# Patient Record
Sex: Female | Born: 1950 | Race: White | Hispanic: No | Marital: Married | Smoking: Never smoker
Health system: Southern US, Community
[De-identification: ages and names within clinical notes are randomized; demographics above are authoritative.]

## PROBLEM LIST (undated history)

## (undated) HISTORY — PX: COLON SURGERY: SHX602

## (undated) HISTORY — PX: THROAT SURGERY: SHX803

## (undated) HISTORY — PX: ABDOMINAL HYSTERECTOMY: SHX81

---

## 2010-05-08 ENCOUNTER — Ambulatory Visit: Payer: Self-pay | Admitting: Family Medicine

## 2011-07-19 ENCOUNTER — Ambulatory Visit: Payer: Self-pay | Admitting: Family Medicine

## 2019-12-23 ENCOUNTER — Emergency Department (HOSPITAL_BASED_OUTPATIENT_CLINIC_OR_DEPARTMENT_OTHER)
Admission: EM | Admit: 2019-12-23 | Discharge: 2019-12-24 | Disposition: A | Discharge status: Home / Self Care | Payer: Medicare HMO | Attending: Emergency Medicine | Admitting: Emergency Medicine

## 2019-12-23 ENCOUNTER — Other Ambulatory Visit: Payer: Self-pay

## 2019-12-23 ENCOUNTER — Emergency Department (HOSPITAL_BASED_OUTPATIENT_CLINIC_OR_DEPARTMENT_OTHER): Payer: Medicare HMO

## 2019-12-23 ENCOUNTER — Encounter (HOSPITAL_BASED_OUTPATIENT_CLINIC_OR_DEPARTMENT_OTHER): Payer: Self-pay

## 2019-12-23 DIAGNOSIS — R1032 Left lower quadrant pain: Secondary | ICD-10-CM | POA: Diagnosis not present

## 2019-12-23 DIAGNOSIS — R110 Nausea: Secondary | ICD-10-CM | POA: Insufficient documentation

## 2019-12-23 DIAGNOSIS — Z90710 Acquired absence of both cervix and uterus: Secondary | ICD-10-CM | POA: Insufficient documentation

## 2019-12-23 LAB — COMPREHENSIVE METABOLIC PANEL
ALT: 27 U/L (ref 0–44)
AST: 22 U/L (ref 15–41)
Albumin: 4.4 g/dL (ref 3.5–5.0)
Alkaline Phosphatase: 68 U/L (ref 38–126)
Anion gap: 11 (ref 5–15)
BUN: 6 mg/dL — ABNORMAL LOW (ref 8–23)
CO2: 27 mmol/L (ref 22–32)
Calcium: 9.1 mg/dL (ref 8.9–10.3)
Chloride: 97 mmol/L — ABNORMAL LOW (ref 98–111)
Creatinine, Ser: 0.5 mg/dL (ref 0.44–1.00)
GFR calc Af Amer: >60 mL/min (ref >60)
GFR calc non Af Amer: >60 mL/min (ref >60)
Glucose, Bld: 95 mg/dL (ref 70–99)
Potassium: 3.7 mmol/L (ref 3.5–5.1)
Sodium: 135 mmol/L (ref 135–145)
Total Bilirubin: 0.5 mg/dL (ref 0.3–1.2)
Total Protein: 7.8 g/dL (ref 6.5–8.1)

## 2019-12-23 LAB — CBC
HCT: 41.2 % (ref 36.0–46.0)
Hemoglobin: 14 g/dL (ref 12.0–15.0)
MCH: 31 pg (ref 26.0–34.0)
MCHC: 34 g/dL (ref 30.0–36.0)
MCV: 91.4 fL (ref 80.0–100.0)
Platelets: 225 10*3/uL (ref 150–400)
RBC: 4.51 MIL/uL (ref 3.87–5.11)
RDW: 12.5 % (ref 11.5–15.5)
WBC: 6.7 10*3/uL (ref 4.0–10.5)
nRBC: 0 % (ref 0.0–0.2)

## 2019-12-23 LAB — URINALYSIS, ROUTINE W REFLEX MICROSCOPIC
Bilirubin Urine: NEGATIVE
Glucose, UA: NEGATIVE mg/dL
Hgb urine dipstick: NEGATIVE
Ketones, ur: NEGATIVE mg/dL
Leukocytes,Ua: NEGATIVE
Nitrite: NEGATIVE
Protein, ur: NEGATIVE mg/dL
Specific Gravity, Urine: 1.01 (ref 1.005–1.030)
pH: 6 (ref 5.0–8.0)

## 2019-12-23 LAB — OCCULT BLOOD X 1 CARD TO LAB, STOOL: Fecal Occult Bld: NEGATIVE

## 2019-12-23 LAB — LIPASE, BLOOD: Lipase: 36 U/L (ref 11–51)

## 2019-12-23 MED ORDER — ONDANSETRON HCL 4 MG/2ML IJ SOLN
4.0000 mg | Freq: Once | INTRAMUSCULAR | Status: AC | PRN
  Administered 2019-12-23: 4 mg via INTRAVENOUS
  Filled 2019-12-23: qty 2

## 2019-12-23 MED ORDER — FENTANYL CITRATE (PF) 100 MCG/2ML IJ SOLN
50.0000 ug | Freq: Once | INTRAMUSCULAR | Status: AC
  Administered 2019-12-23: 50 ug via INTRAVENOUS
  Filled 2019-12-23: qty 2

## 2019-12-23 MED ORDER — SODIUM CHLORIDE 0.9% FLUSH
3.0000 mL | Freq: Once | INTRAVENOUS | Status: DC
  Filled 2019-12-23: qty 3

## 2019-12-23 MED ORDER — IOHEXOL 300 MG/ML  SOLN
100.0000 mL | Freq: Once | INTRAMUSCULAR | Status: AC | PRN
  Administered 2019-12-23: 80 mL via INTRAVENOUS

## 2019-12-23 NOTE — ED Notes (Signed)
Witnessed EDP [place] perform a rectal exam. Pt tolerated well

## 2019-12-23 NOTE — ED Provider Notes (Signed)
[facility] Provider Note: Lowella Dell, MD, FACEP  CSN: [id number] MRN: [id number] ARRIVAL: 12/23/19 at 2014 ROOM: MH03/MH03   CHIEF COMPLAINT  Abdominal Pain   HISTORY OF PRESENT ILLNESS  12/23/19 11:12 PM Diana Wang is a 69 y.o. female with about 2-1/2 weeks of abdominal pain.  The abdominal pain is most prominent in the left lower quadrant and she rates it as an 8 out of 10.  It is worse with palpation or movement.  She has had nausea with this but no vomiting.  She had black tarry stools earlier in the course but states her stools have become normal colored since.  She has had intermittently had diarrhea.  She was seen at [facility] 2 weeks ago and had a CT scan that was unremarkable.  She was diagnosed with colitis and placed on a course of ciprofloxacin and Flagyl which she has completed without relief.  She also is complaining about not in her left antecubital fossa at the site of previous IV.  This knot is tender but not red.   History reviewed. No pertinent past medical history.  Past Surgical History:  Procedure Laterality Date   ABDOMINAL HYSTERECTOMY     CESAREAN SECTION     COLON SURGERY     THROAT SURGERY      No family history on file.  Social History   Tobacco Use   Smoking status: Never Smoker   Smokeless tobacco: Never Used  Vaping Use   Vaping Use: Never used  Substance Use Topics   Alcohol use: Never   Drug use: Never    Prior to Admission medications   Not on File    Allergies Patient has no known allergies.   REVIEW OF SYSTEMS  Negative except as noted here or in the History of Present Illness.   PHYSICAL EXAMINATION  Initial Vital Signs Blood pressure (!) 158/92, pulse 64, temperature 98.9 F (37.2 C), temperature source Oral, resp. rate 13, height 5\' 4"  (1.626 m), weight 56.2 kg, SpO2 95 %.  Examination General: Well-developed, well-nourished female in no acute distress; appearance consistent  with age of record HENT: normocephalic; atraumatic Eyes: pupils equal, round and reactive to light; extraocular muscles intact Neck: supple Heart: regular rate and rhythm Lungs: clear to auscultation bilaterally Abdomen: soft; nondistended; lower tenderness most prominent in the left lower quadrant; no masses or hepatosplenomegaly; bowel sounds present Extremities: No deformity; full range of motion; pulses normal; tender, nonfluctuant mass of left antecubital fossa without erythema or warmth Neurologic: Awake, alert and oriented; motor function intact in all extremities and symmetric; no facial droop Skin: Warm and dry Psychiatric: Flat affect   RESULTS  Summary of this visit's results, reviewed and interpreted by myself:   EKG Interpretation  Date/Time:    Ventricular Rate:    PR Interval:    QRS Duration:   QT Interval:    QTC Calculation:   R Axis:     Text Interpretation:        Laboratory Studies: Results for orders placed or performed during the hospital encounter of 12/23/19 (from the past 24 hour(s))  Lipase, blood     Status: None   Collection Time: 12/23/19  8:28 PM  Result Value Ref Range   Lipase 36 11 - 51 U/L  Comprehensive metabolic panel     Status: Abnormal   Collection Time: 12/23/19  8:28 PM  Result Value Ref Range   Sodium 135 135 - 145 mmol/L  Potassium 3.7 3.5 - 5.1 mmol/L   Chloride 97 (L) 98 - 111 mmol/L   CO2 27 22 - 32 mmol/L   Glucose, Bld 95 70 - 99 mg/dL   BUN 6 (L) 8 - 23 mg/dL   Creatinine, Ser 9.140.50 0.44 - 1.00 mg/dL   Calcium 9.1 8.9 - 78.210.3 mg/dL   Total Protein 7.8 6.5 - 8.1 g/dL   Albumin 4.4 3.5 - 5.0 g/dL   AST 22 15 - 41 U/L   ALT 27 0 - 44 U/L   Alkaline Phosphatase 68 38 - 126 U/L   Total Bilirubin 0.5 0.3 - 1.2 mg/dL   GFR calc non Af Amer >60 >60 mL/min   GFR calc Af Amer >60 >60 mL/min   Anion gap 11 5 - 15  CBC     Status: None   Collection Time: 12/23/19  8:28 PM  Result Value Ref Range   WBC 6.7 4.0 - 10.5 K/uL     RBC 4.51 3.87 - 5.11 MIL/uL   Hemoglobin 14.0 12.0 - 15.0 g/dL   HCT 95.641.2 36 - 46 %   MCV 91.4 80.0 - 100.0 fL   MCH 31.0 26.0 - 34.0 pg   MCHC 34.0 30.0 - 36.0 g/dL   RDW 21.312.5 08.611.5 - 57.815.5 %   Platelets 225 150 - 400 K/uL   nRBC 0.0 0.0 - 0.2 %  Urinalysis, Routine w reflex microscopic     Status: None   Collection Time: 12/23/19  8:29 PM  Result Value Ref Range   Color, Urine YELLOW YELLOW   APPearance CLEAR CLEAR   Specific Gravity, Urine 1.010 1.005 - 1.030   pH 6.0 5.0 - 8.0   Glucose, UA NEGATIVE NEGATIVE mg/dL   Hgb urine dipstick NEGATIVE NEGATIVE   Bilirubin Urine NEGATIVE NEGATIVE   Ketones, ur NEGATIVE NEGATIVE mg/dL   Protein, ur NEGATIVE NEGATIVE mg/dL   Nitrite NEGATIVE NEGATIVE   Leukocytes,Ua NEGATIVE NEGATIVE  Occult blood card to lab, stool Provider will collect     Status: None   Collection Time: 12/23/19 11:30 PM  Result Value Ref Range   Fecal Occult Bld NEGATIVE NEGATIVE  D-dimer, quantitative (not at [facility])     Status: Abnormal   Collection Time: 12/23/19 11:40 PM  Result Value Ref Range   D-Dimer, Quant 1.93 (H) 0.00 - 0.50 ug/mL-FEU   Imaging Studies: CT ABDOMEN PELVIS W CONTRAST  Result Date: 12/24/2019 CLINICAL DATA:  69 year old female with 2.5 weeks of left lower quadrant pain, dark stools. Completed antibiotics for presumed colitis. EXAM: CT ABDOMEN AND PELVIS WITH CONTRAST TECHNIQUE: Multidetector CT imaging of the abdomen and pelvis was performed using the standard protocol following bolus administration of intravenous contrast. CONTRAST:  80mL OMNIPAQUE IOHEXOL 300 MG/ML  SOLN [facility]dom[facility]vis 12/07/2019, and earlier FINDINGS: Lower chest: Mild dependent atelectasis with possible punctate right costophrenic angle calcified granulomas is unchanged. No pericardial or pleural effusion. Hepatobiliary: Negative liver and gallbladder. Pancreas: Negative. Spleen: Negative.  Adrenals/Urinary Tract: Normal adrenal glands. Bilateral renal enhancement and contrast excretion is symmetric and normal. Numerous gonadal vein and pelvic phleboliths, but the left ureter appears to remain normal to the bladder. Unremarkable urinary bladder. Stomach/Bowel: Redundant sigmoid colon. Decompressed distal sigmoid and rectum. Mild retained stool in the proximal sigmoid. More liquid appearing stool in the descending and transverse colon. In the right upper quadrant there is a small to large bowel anastomosis with no adverse features.  No large bowel inflammation. No dilated small bowel. Decompressed and negative stomach. No free air, free fluid, or mesenteric stranding. There is ventral abdominal wall muscle diastasis, and a small fat containing infraumbilical hernia in the midline (series 2, image 58), but no bowel containing hernia. Vascular/Lymphatic: Major arterial structures are patent. Mild atherosclerosis. Portal venous system is patent. No lymphadenopathy. Reproductive: Absent uterus.  Diminutive or absent ovaries. Other: No pelvic free fluid. Musculoskeletal: No acute osseous abnormality identified. IMPRESSION: Stable since last month. No convincing acute or inflammatory process in the abdomen or pelvis. Previous bowel surgery with no adverse features. Abdominal wall muscle diastasis and a small fat containing infraumbilical hernia, but no bowel containing hernia. Electronically Signed   By: Odessa Fleming M.D.   On: 12/24/2019 00:23   US Venous Img Upper Uni Left  Result Date: 12/24/2019 CLINICAL DATA:  69 year old female with left upper extremity swelling. EXAM: Left UPPER EXTREMITY VENOUS DOPPLER ULTRASOUND TECHNIQUE: Gray-scale sonography with graded compression, as well as color Doppler and duplex ultrasound were performed to evaluate the upper extremity deep venous system from the level of the subclavian vein and including the jugular, axillary, basilic, radial, ulnar and upper cephalic vein.  Spectral Doppler was utilized to evaluate flow at rest and with distal augmentation maneuvers. COMPARISON:  None. FINDINGS: Contralateral Subclavian Vein: Respiratory phasicity is normal and symmetric with the symptomatic side. No evidence of thrombus. Normal compressibility. Internal Jugular Vein: No evidence of thrombus. Normal compressibility, respiratory phasicity and response to augmentation. Subclavian Vein: No evidence of thrombus. Normal compressibility, respiratory phasicity and response to augmentation. Axillary Vein: No evidence of thrombus. Normal compressibility, respiratory phasicity and response to augmentation. Cephalic Vein: No evidence of thrombus. Normal compressibility, respiratory phasicity and response to augmentation. Basilic Vein: No evidence of thrombus. Normal compressibility, respiratory phasicity and response to augmentation. Brachial Veins: No evidence of thrombus. Normal compressibility, respiratory phasicity and response to augmentation. Radial Veins: No evidence of thrombus. Normal compressibility, respiratory phasicity and response to augmentation. Ulnar Veins: No evidence of thrombus. Normal compressibility, respiratory phasicity and response to augmentation. Venous Reflux:  None visualized. Other Findings:  None visualized. IMPRESSION: No evidence of DVT within the left upper extremity. Electronically Signed   By: Elgie Collard M.D.   On: 12/24/2019 00:39    ED COURSE and MDM  Nursing notes, initial and subsequent vitals signs, including pulse oximetry, reviewed and interpreted by myself.  Vitals:   12/23/19 2026 12/23/19 2027 12/23/19 2250 12/24/19 0135  BP: (!) 158/99  (!) 158/92 (!) 151/80  Pulse: 73  64 65  Resp: 18  13 17   Temp: 98.9 F (37.2 C)     TempSrc: Oral     SpO2: 98%  95% 96%  Weight:  56.2 kg    Height:  5\' 4"  (1.626 m)     Medications  sodium chloride flush (NS) 0.9 % injection 3 mL (has no administration in time range)  ondansetron (ZOFRAN)  injection 4 mg (4 mg Intravenous Given 12/23/19 2350)  fentaNYL (SUBLIMAZE) injection 50 mcg (50 mcg Intravenous Given 12/23/19 2350)  iohexol (OMNIPAQUE) 300 MG/ML solution 100 mL (80 mLs Intravenous Contrast Given 12/23/19 2358)  dicyclomine (BENTYL) injection 20 mg (20 mg Intramuscular Given 12/24/19 0130)   1:03 AM The patient was advised of reassuring CT and ultrasound findings.  The cause of her pain is unclear.  She has been taking dicyclomine at home with only partial relief.  She was previously treated with oxycodone and Zofran but is out  of both of these.  I explained to her that I do not believe narcotic pain medication is in her best interest given that the source of her pain is likely her bowels.  She would like to try a shot of dicyclomine here.  2:13 AM Pain improved with IM dicyclomine.  She states she does not need a refill of this but is out of Zofran.  Again she was advised of reassuring labs and CT scan as well as reassuring left upper extremity Doppler (I suspect she has some localized myositis from previous IV but with no signs of infection).  She has a gastroenterologist and she was advised to call for follow-up as this likely will require specialty follow-up.  PROCEDURES  Procedures   ED DIAGNOSES     ICD-10-CM   1. Abdominal pain, left lower quadrant  R10.32   2. Nausea  R11.0        Teddy Rebstock, Jonny Ruiz, MD 12/24/19 [phone]

## 2019-12-23 NOTE — ED Triage Notes (Signed)
Pt c/o abd pain, n/v/d, black tarry stools x 2 weeks-states she was dx with colitis and completed abx-pt also c/o pain to left UE where she had IV-NAD-steady gait

## 2019-12-24 ENCOUNTER — Emergency Department (HOSPITAL_BASED_OUTPATIENT_CLINIC_OR_DEPARTMENT_OTHER): Payer: Medicare HMO

## 2019-12-24 LAB — D-DIMER, QUANTITATIVE: D-Dimer, Quant: 1.93 ug/mL-FEU — ABNORMAL HIGH (ref 0.00–0.50)

## 2019-12-24 MED ORDER — ONDANSETRON 8 MG PO TBDP: 8.0000 mg | ORAL_TABLET | Freq: Three times a day (TID) | ORAL | 1 refills | Status: AC | PRN

## 2019-12-24 MED ORDER — DICYCLOMINE HCL 10 MG/ML IM SOLN
20.0000 mg | Freq: Once | INTRAMUSCULAR | Status: AC
  Administered 2019-12-24: 20 mg via INTRAMUSCULAR
  Filled 2019-12-24: qty 2

## 2021-05-26 IMAGING — CT CT ABD-PELV W/ CM
2 of 5 series · 15 of 46 positions shown, 17 images · IV contrast (Omnipaque)
Comparison: [REDACTED] [HOSPITAL] [HOSPITAL] CT
Abdomen and Pelvis 12/07/2019, and earlier

CLINICAL DATA: 69-year-old female with 2.5 weeks of left lower
quadrant pain, dark stools. Completed antibiotics for presumed
colitis.

EXAM:
CT ABDOMEN AND PELVIS WITH CONTRAST
TECHNIQUE: Multidetector CT imaging of the abdomen and pelvis was performed
using the standard protocol following bolus administration of
intravenous contrast.
CONTRAST:  80mL OMNIPAQUE IOHEXOL 300 MG/ML  SOLN

[Series 2: axial st · axial · 0.63mm/px · z∈[-649,-244]mm · 12 of 91 slices shown, 14 images]
[im 5/91  soft-tissue]
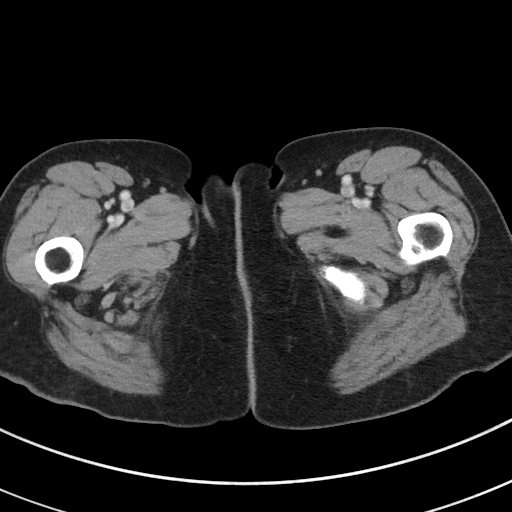
[im 5/91  bone]
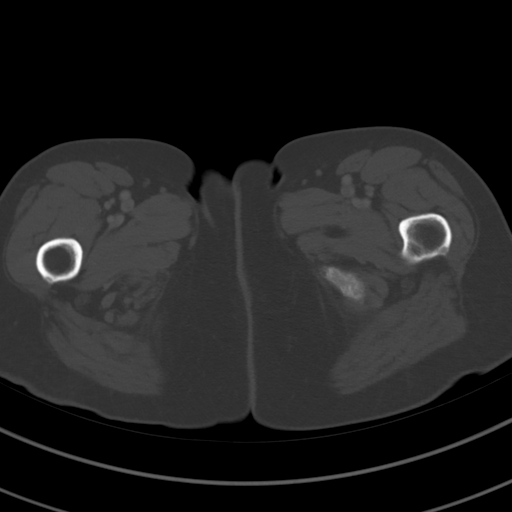
[im 15/91  soft-tissue]
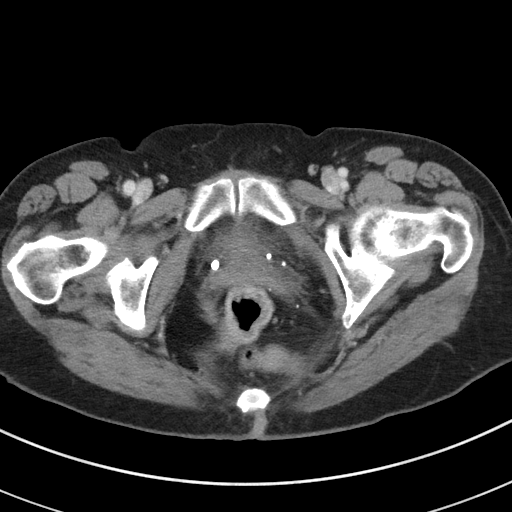
[im 19/91  soft-tissue]
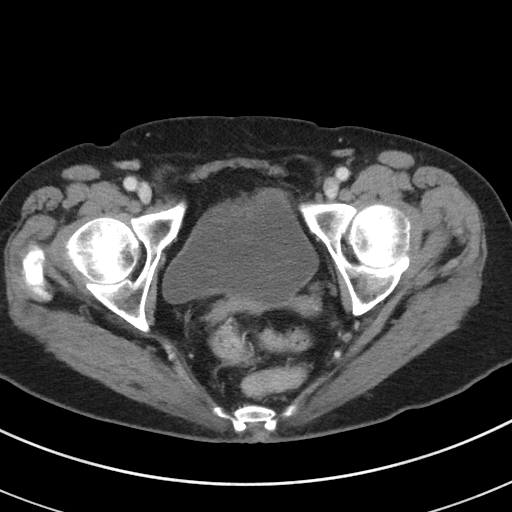
[im 29/91  soft-tissue]
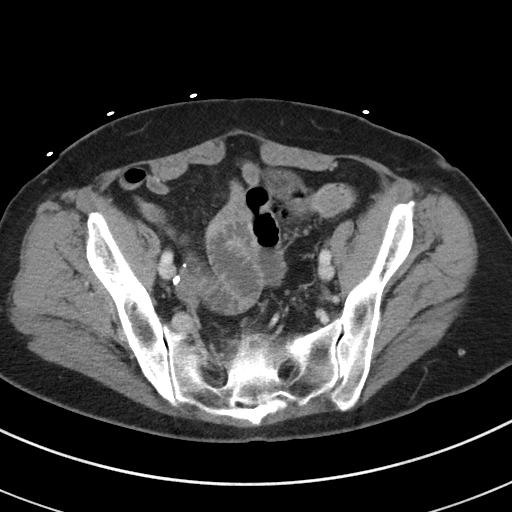
[im 34/91  soft-tissue]
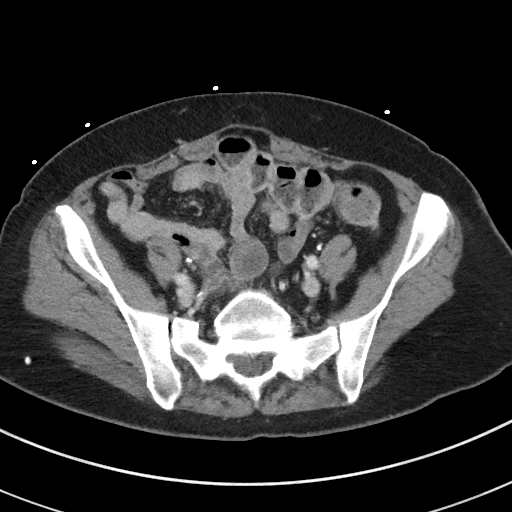
[im 43/91  soft-tissue]
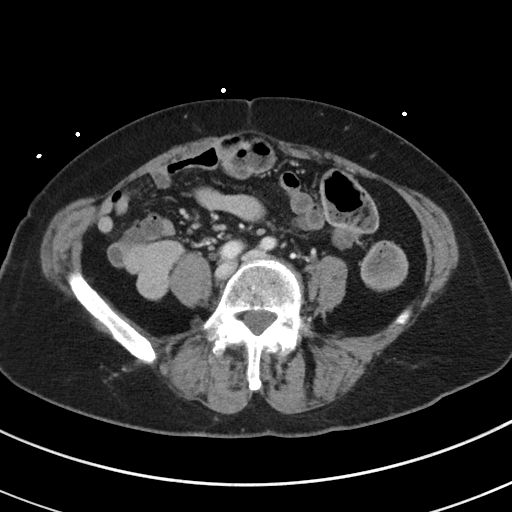
[im 48/91  soft-tissue]
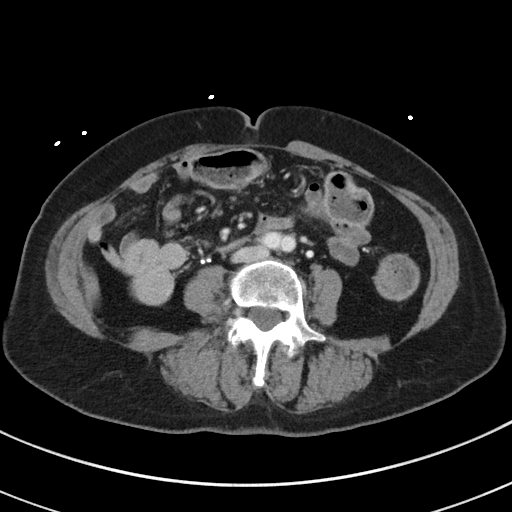
[im 57/91  soft-tissue]
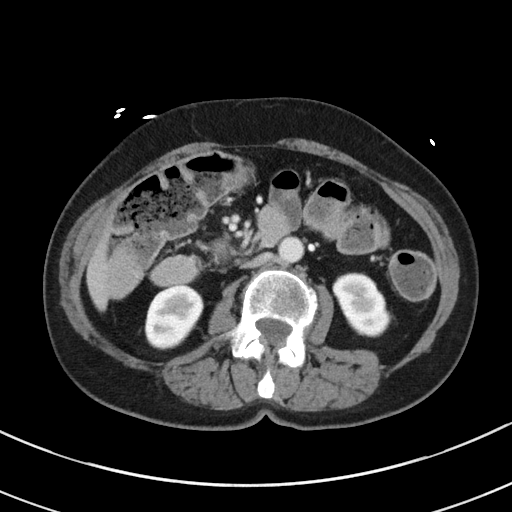
[im 62/91  soft-tissue]
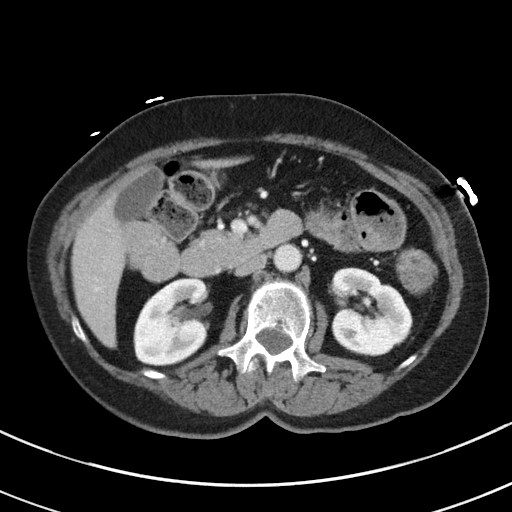
[im 62/91  bone]
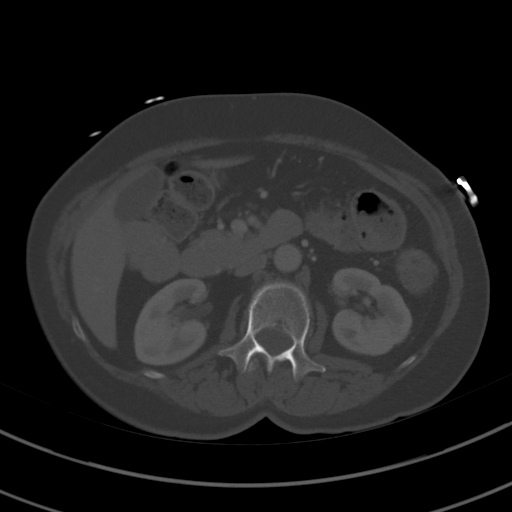
[im 72/91  soft-tissue]
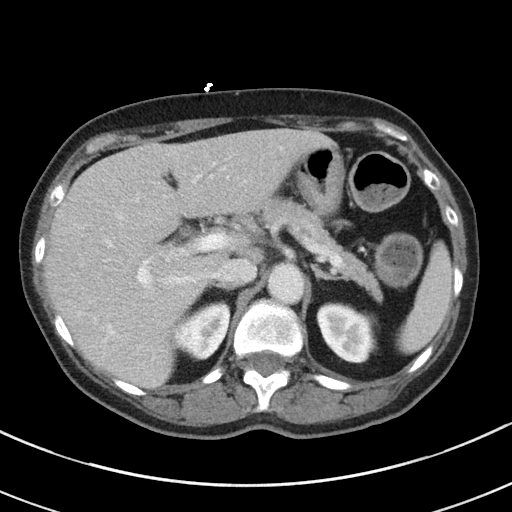
[im 76/91  soft-tissue]
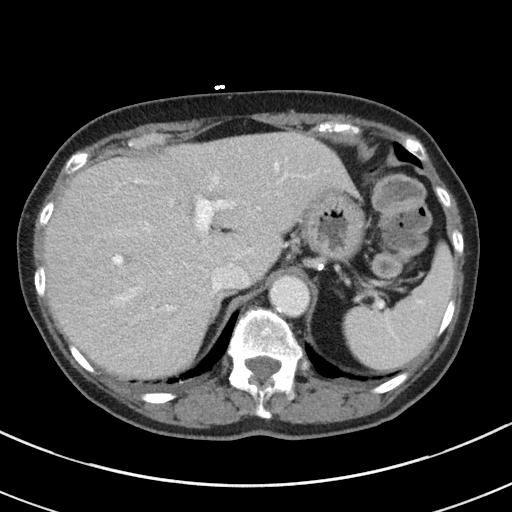
[im 86/91  soft-tissue]
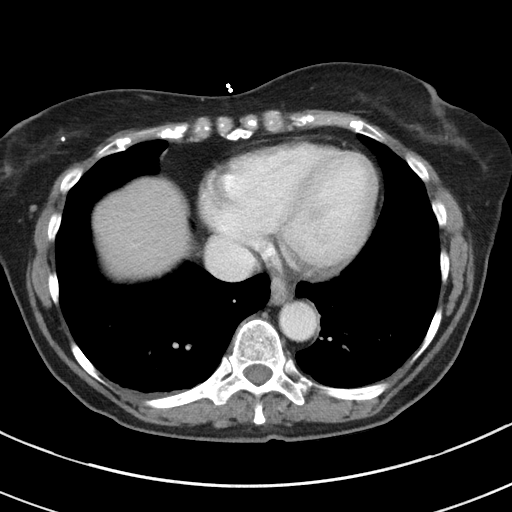

[Series 5: coronal st · coronal · 0.56mm/px · 3 of 87 slices shown]
[im 29/87  soft-tissue]
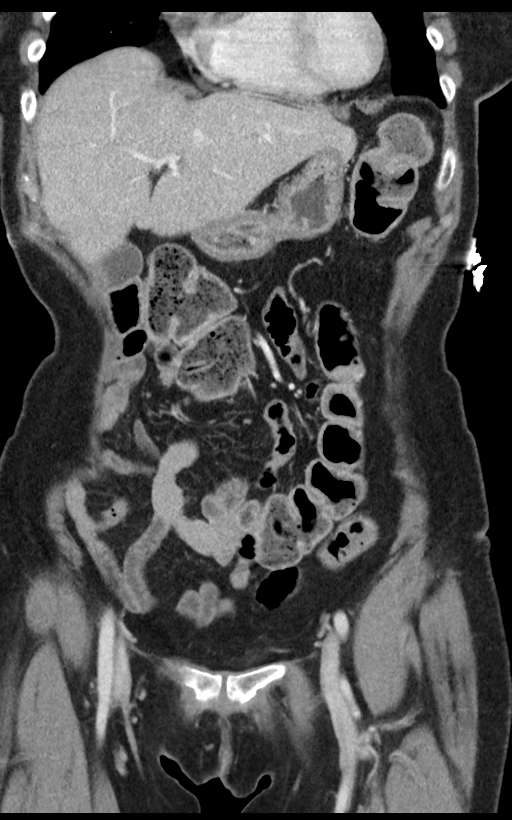
[im 39/87  soft-tissue]
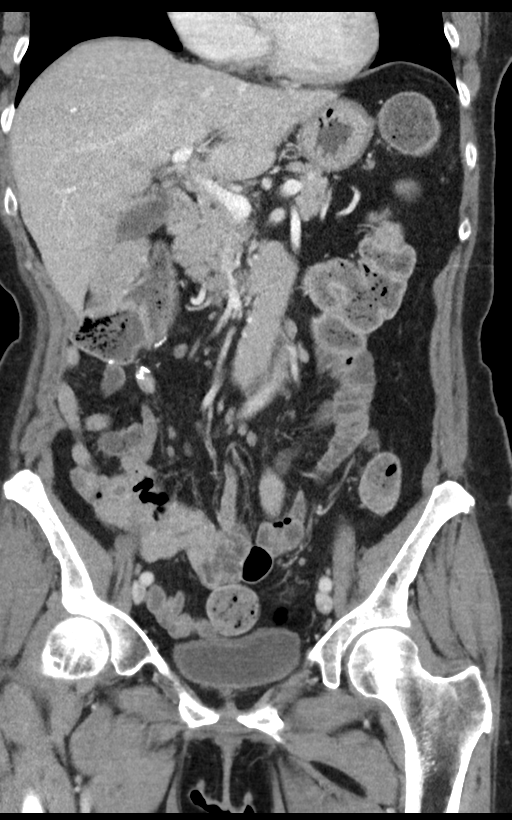
[im 48/87  soft-tissue]
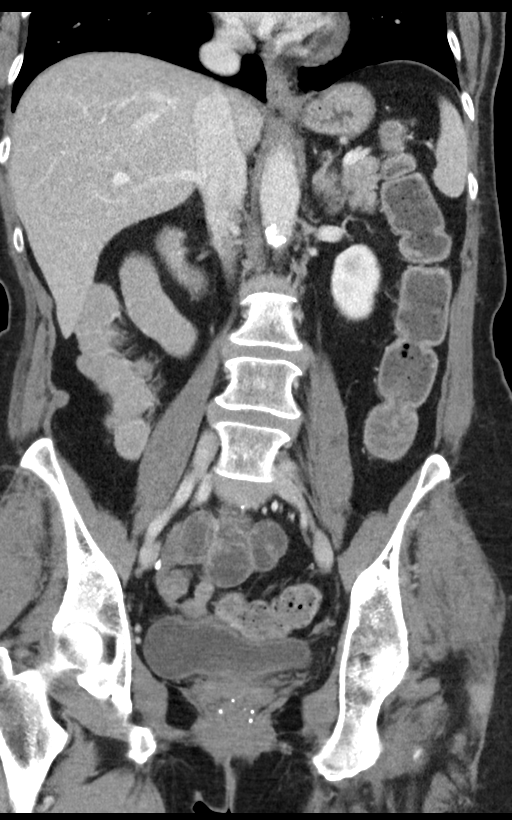

[15 of 46 positions shown; findings below may reference images not displayed]

FINDINGS: Lower chest: Mild dependent atelectasis with possible punctate right
costophrenic angle calcified granulomas is unchanged. No pericardial
or pleural effusion.

Hepatobiliary: Negative liver and gallbladder.

Pancreas: Negative.

Spleen: Negative.

Adrenals/Urinary Tract: Normal adrenal glands.

Bilateral renal enhancement and contrast excretion is symmetric and
normal. Numerous gonadal vein and pelvic phleboliths, but the left
ureter appears to remain normal to the bladder. Unremarkable urinary
bladder.

Stomach/Bowel: Redundant sigmoid colon. Decompressed distal sigmoid
and rectum. Mild retained stool in the proximal sigmoid. More liquid
appearing stool in the descending and transverse colon. In the right
upper quadrant there is a small to large bowel anastomosis with no
adverse features. No large bowel inflammation. No dilated small
bowel. Decompressed and negative stomach. No free air, free fluid,
or mesenteric stranding.

There is ventral abdominal wall muscle diastasis, and a small fat
containing infraumbilical hernia in the midline (series 2, image
58), but no bowel containing hernia.

Vascular/Lymphatic: Major arterial structures are patent. Mild
atherosclerosis. Portal venous system is patent. No lymphadenopathy.

Reproductive: Absent uterus.  Diminutive or absent ovaries.

Other: No pelvic free fluid.

Musculoskeletal: No acute osseous abnormality identified.
IMPRESSION: Stable since last month. No convincing acute or inflammatory process
in the abdomen or pelvis.

Previous bowel surgery with no adverse features. Abdominal wall
muscle diastasis and a small fat containing infraumbilical hernia,
but no bowel containing hernia.

## 2021-05-26 IMAGING — US US EXTREM  UP VENOUS*L*
1 series · 13 of 24 positions shown · non-contrast
Comparison: None.

CLINICAL DATA: 69-year-old female with left upper extremity
swelling.



[Series 1: us extrem up venous*left* · 13 of 26 slices shown]
[im 1/26]
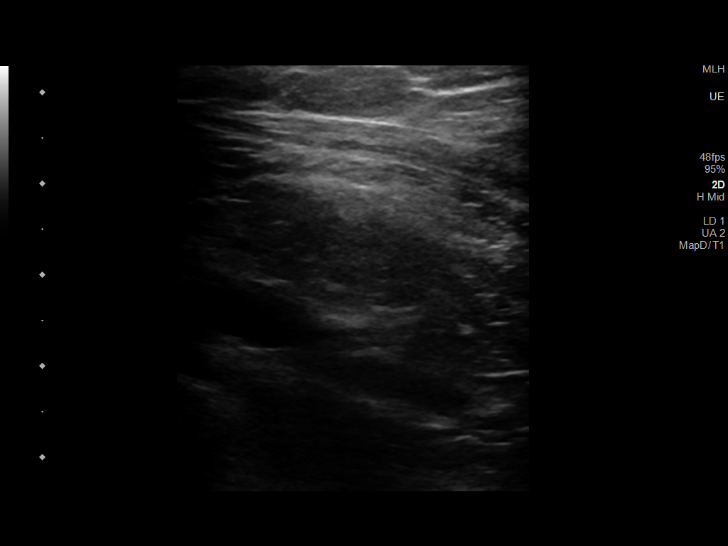
[im 3/26]
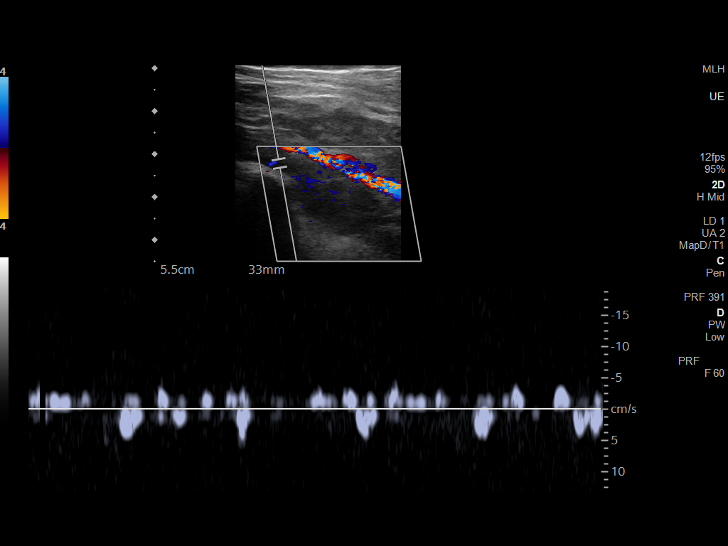
[im 5/26]
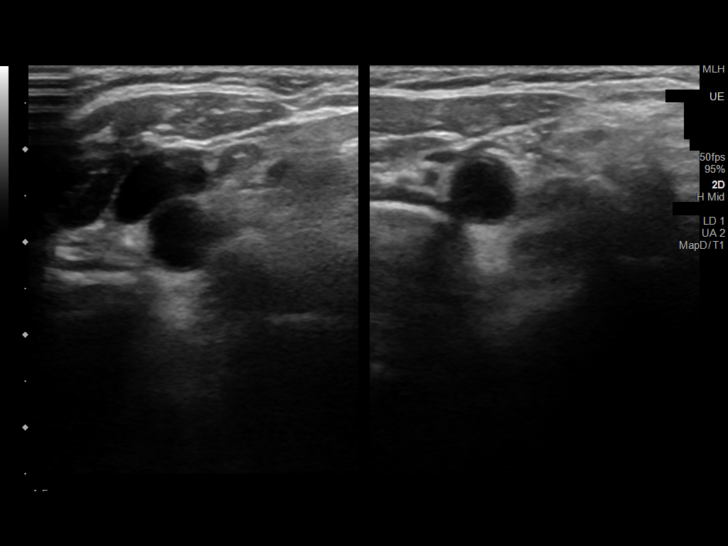
[im 7/26]
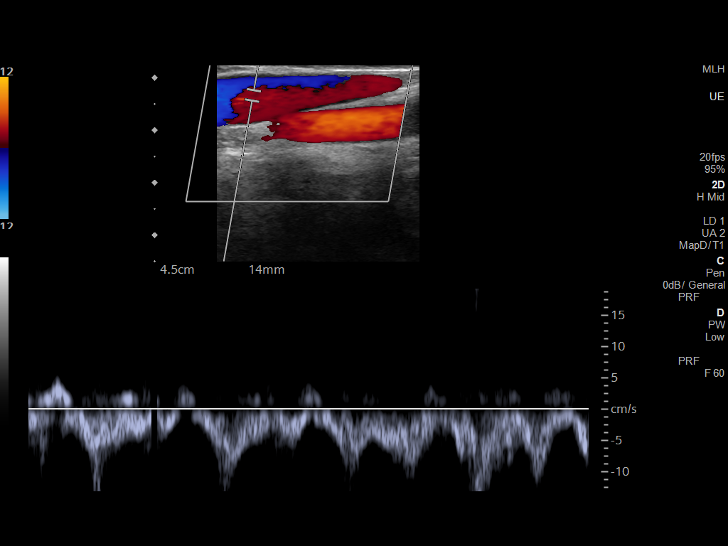
[im 9/26]
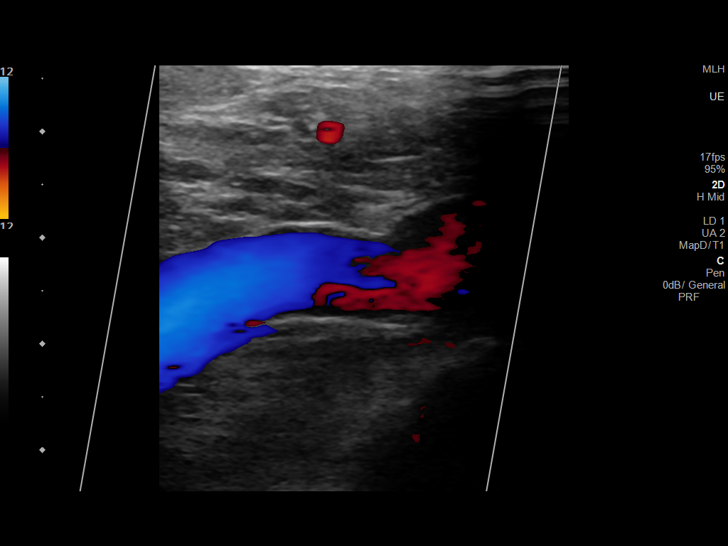
[im 11/26]
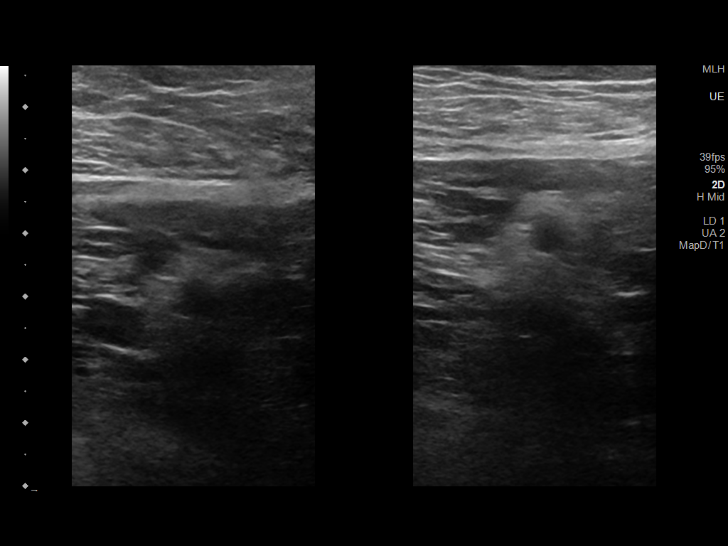
[im 14/26]
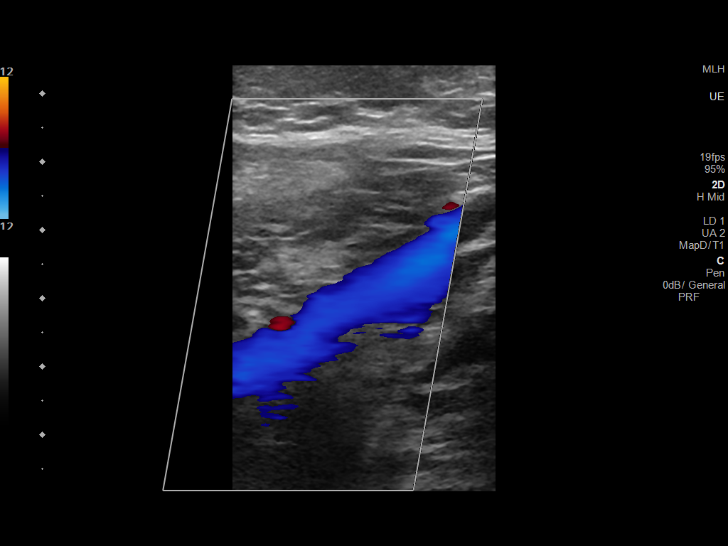
[im 15/26]
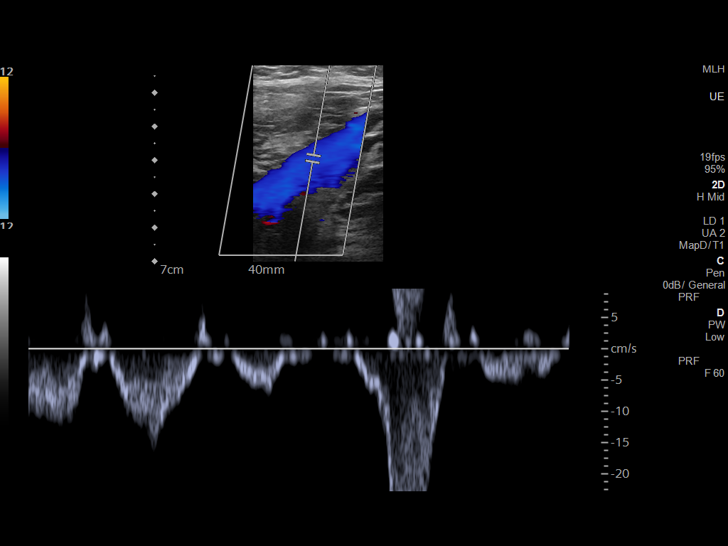
[im 17/26]
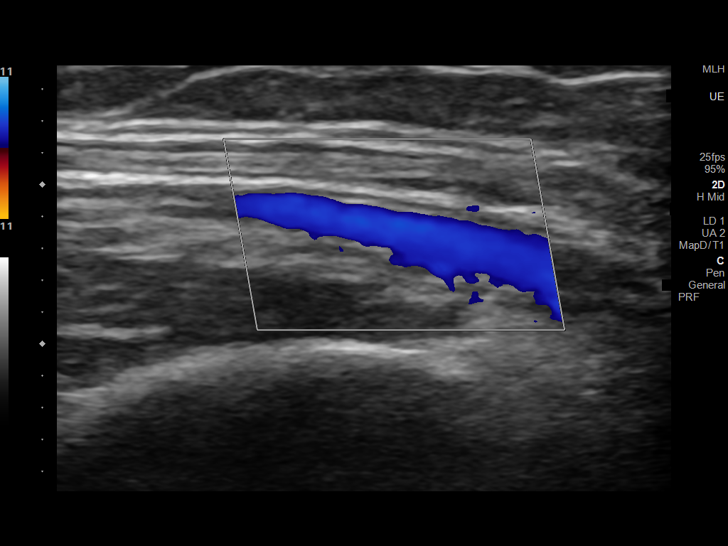
[im 19/26]
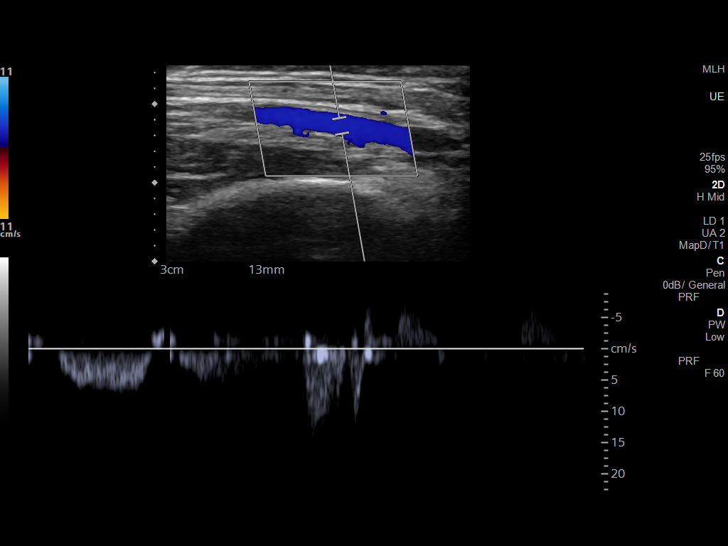
[im 21/26]
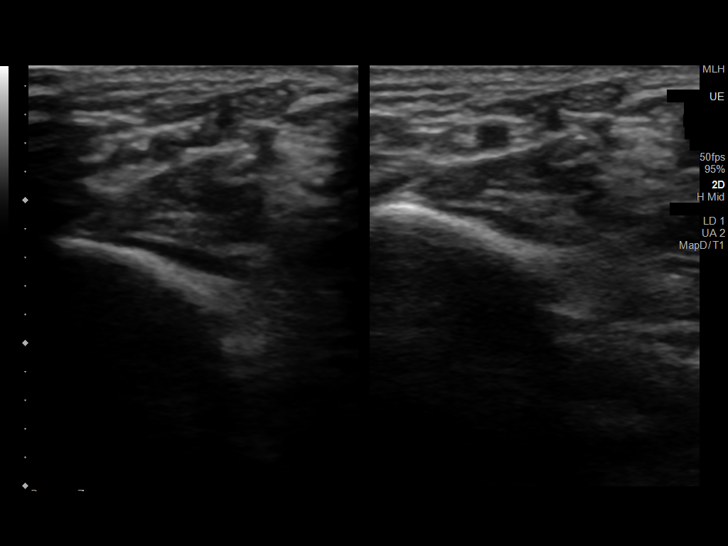
[im 23/26]
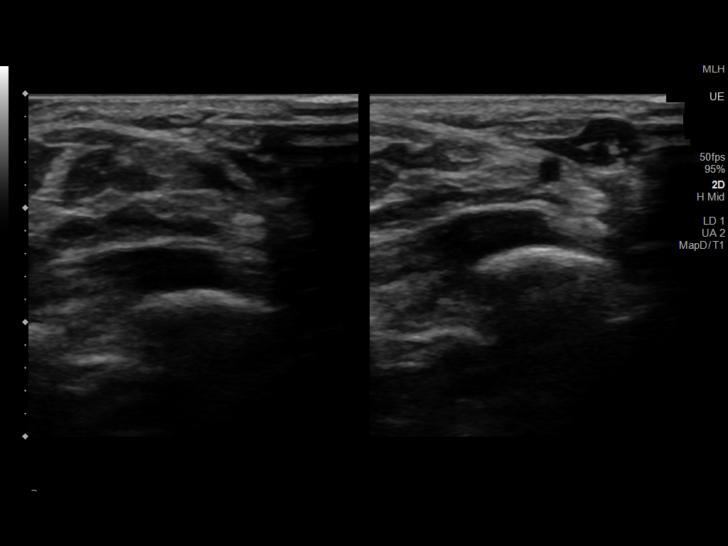
[im 26/26]
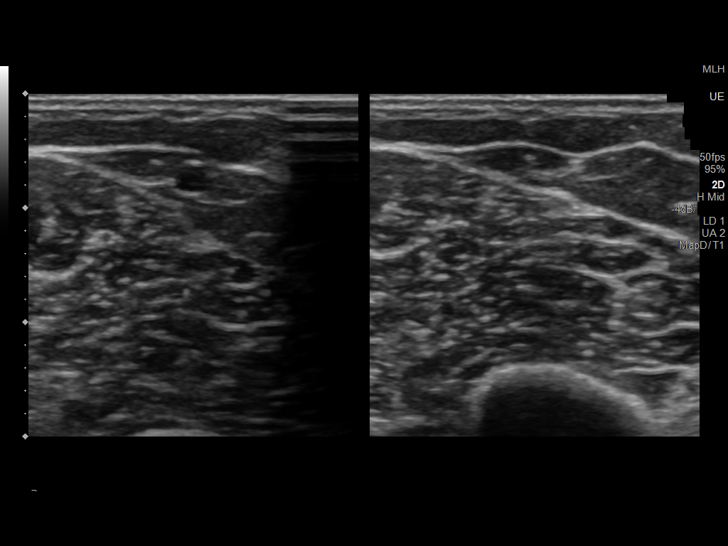

[13 of 24 positions shown; findings below may reference images not displayed]

FINDINGS: Contralateral Subclavian Vein: Respiratory phasicity is normal and
symmetric with the symptomatic side. No evidence of thrombus. Normal
compressibility.

Internal Jugular Vein: No evidence of thrombus. Normal
compressibility, respiratory phasicity and response to augmentation.

Subclavian Vein: No evidence of thrombus. Normal compressibility,
respiratory phasicity and response to augmentation.

Axillary Vein: No evidence of thrombus. Normal compressibility,
respiratory phasicity and response to augmentation.

Cephalic Vein: No evidence of thrombus. Normal compressibility,
respiratory phasicity and response to augmentation.

Basilic Vein: No evidence of thrombus. Normal compressibility,
respiratory phasicity and response to augmentation.

Brachial Veins: No evidence of thrombus. Normal compressibility,
respiratory phasicity and response to augmentation.

Radial Veins: No evidence of thrombus. Normal compressibility,
respiratory phasicity and response to augmentation.

Ulnar Veins: No evidence of thrombus. Normal compressibility,
respiratory phasicity and response to augmentation.

Venous Reflux:  None visualized.

Other Findings:  None visualized.
IMPRESSION: No evidence of DVT within the left upper extremity.
